# Patient Record
Sex: Male | Born: 1995 | Race: White | Hispanic: No | Marital: Single | Smoking: Never smoker
Health system: Southern US, Community
[De-identification: ages and names within clinical notes are randomized; demographics above are authoritative.]

---
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
## 2015-01-04 ENCOUNTER — Ambulatory Visit: Payer: Self-pay | Admitting: Family Medicine

## 2015-01-12 ENCOUNTER — Ambulatory Visit
Admit: 2015-01-12 | Disposition: A | Discharge status: Home / Self Care | Payer: Self-pay | Attending: Family Medicine | Admitting: Family Medicine

## 2015-07-11 DIAGNOSIS — J45901 Unspecified asthma with (acute) exacerbation: Secondary | ICD-10-CM | POA: Diagnosis not present

## 2015-07-11 DIAGNOSIS — J069 Acute upper respiratory infection, unspecified: Secondary | ICD-10-CM | POA: Diagnosis not present

## 2016-06-12 ENCOUNTER — Ambulatory Visit (INDEPENDENT_AMBULATORY_CARE_PROVIDER_SITE_OTHER): Payer: 59 | Admitting: Family Medicine

## 2016-06-12 ENCOUNTER — Encounter: Payer: Self-pay | Admitting: Family Medicine

## 2016-06-12 DIAGNOSIS — Z87312 Personal history of (healed) stress fracture: Secondary | ICD-10-CM
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
## 2016-06-13 LAB — CBC WITH DIFFERENTIAL/PLATELET
BASOS: 0 %
Basophils Absolute: 0 10*3/uL (ref 0.0–0.2)
EOS (ABSOLUTE): 0.1 10*3/uL (ref 0.0–0.4)
EOS: 1 %
HEMATOCRIT: 45.9 % (ref 37.5–51.0)
HEMOGLOBIN: 15.1 g/dL (ref 12.6–17.7)
IMMATURE GRANULOCYTES: 0 %
Immature Grans (Abs): 0 10*3/uL (ref 0.0–0.1)
Lymphocytes Absolute: 2.3 10*3/uL (ref 0.7–3.1)
Lymphs: 25 %
MCH: 31.5 pg (ref 26.6–33.0)
MCHC: 32.9 g/dL (ref 31.5–35.7)
MCV: 96 fL (ref 79–97)
MONOCYTES: 5 %
MONOS ABS: 0.5 10*3/uL (ref 0.1–0.9)
NEUTROS PCT: 69 %
Neutrophils Absolute: 6.3 10*3/uL (ref 1.4–7.0)
Platelets: 225 10*3/uL (ref 150–379)
RBC: 4.8 x10E6/uL (ref 4.14–5.80)
RDW: 13.5 % (ref 12.3–15.4)
WBC: 9.2 10*3/uL (ref 3.4–10.8)

## 2016-06-13 LAB — VITAMIN D 25 HYDROXY (VIT D DEFICIENCY, FRACTURES): Vit D, 25-Hydroxy: 66.2 ng/mL (ref 30.0–100.0)

## 2016-06-13 LAB — FERRITIN: Ferritin: 147 ng/mL (ref 30–400)

## 2016-09-03 ENCOUNTER — Ambulatory Visit (INDEPENDENT_AMBULATORY_CARE_PROVIDER_SITE_OTHER): Payer: 59 | Admitting: Family Medicine

## 2016-09-03 ENCOUNTER — Encounter: Payer: Self-pay | Admitting: Family Medicine

## 2016-09-03 VITALS — BP 105/66 | HR 58 | Temp 97.9°F | Resp 14

## 2016-09-03 DIAGNOSIS — S060X0A Concussion without loss of consciousness, initial encounter: Secondary | ICD-10-CM
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
## 2016-11-04 IMAGING — CR DG TIBIA/FIBULA 2V*L*
1 series · 4 of 4 positions shown · non-contrast
Comparison: No priors.

CLINICAL DATA: 19-year-old male with pain and tenderness for the
past 2 weeks in the left lower leg. Avid runner.

EXAM:
LEFT TIBIA AND FIBULA - 2 VIEW

[Series 1: kdxr tibia and fibula lt(low leg · 0.14mm/px · 4 of 4 slices shown]
[im 1/4]
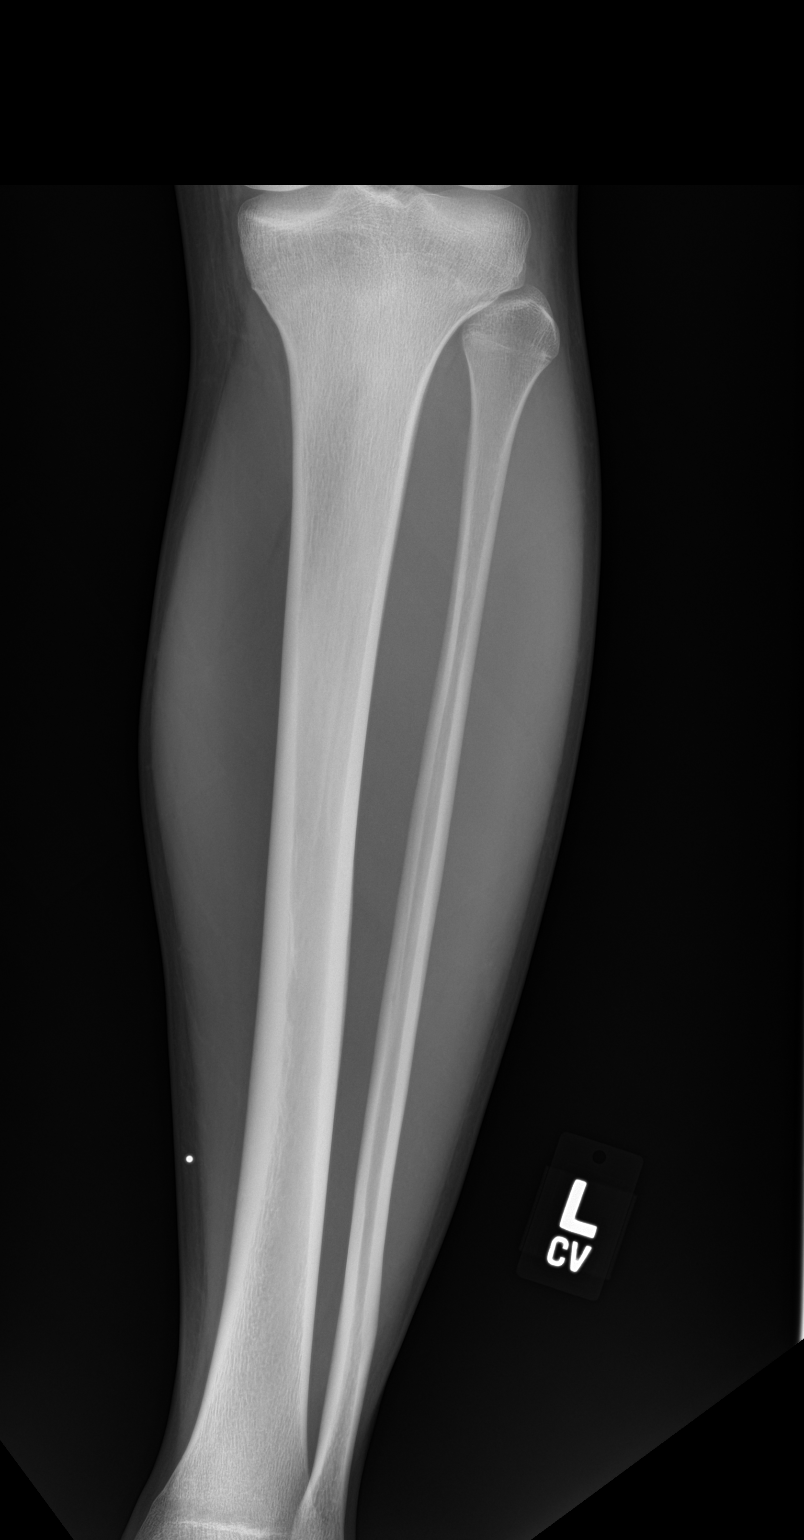
[im 2/4]
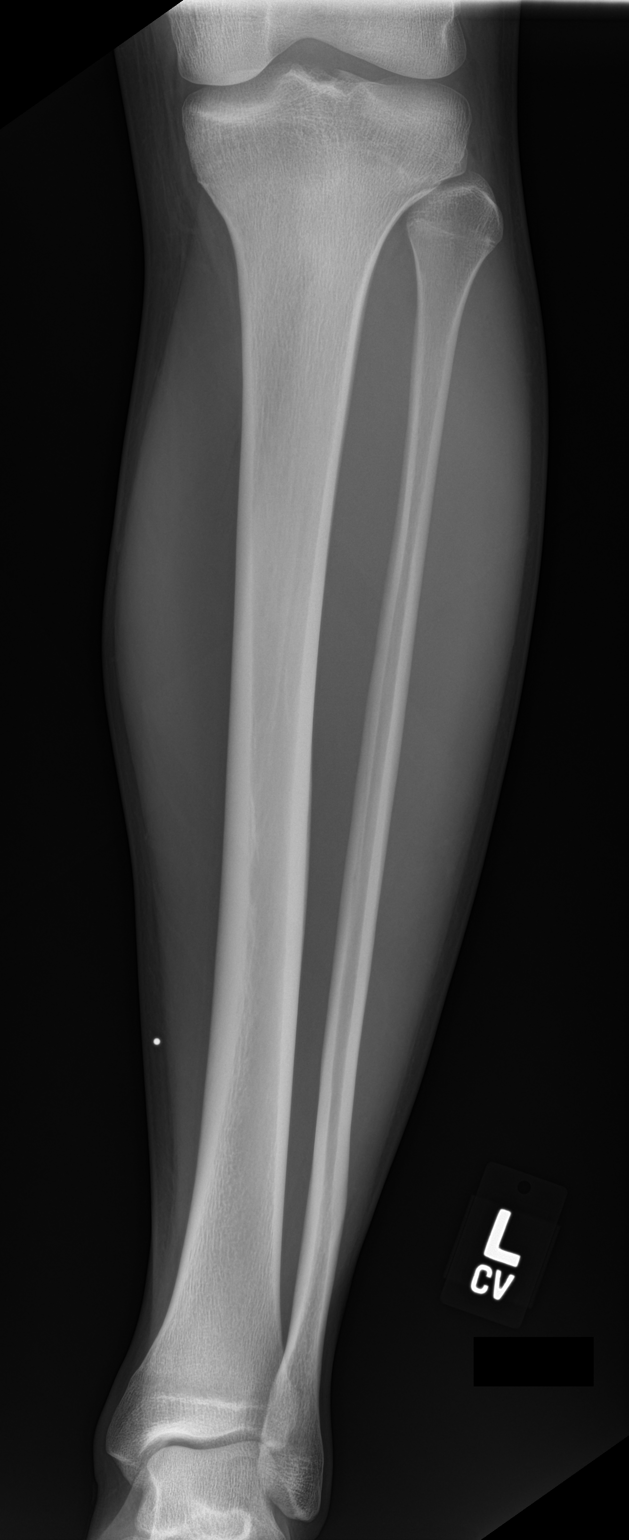
[im 3/4]
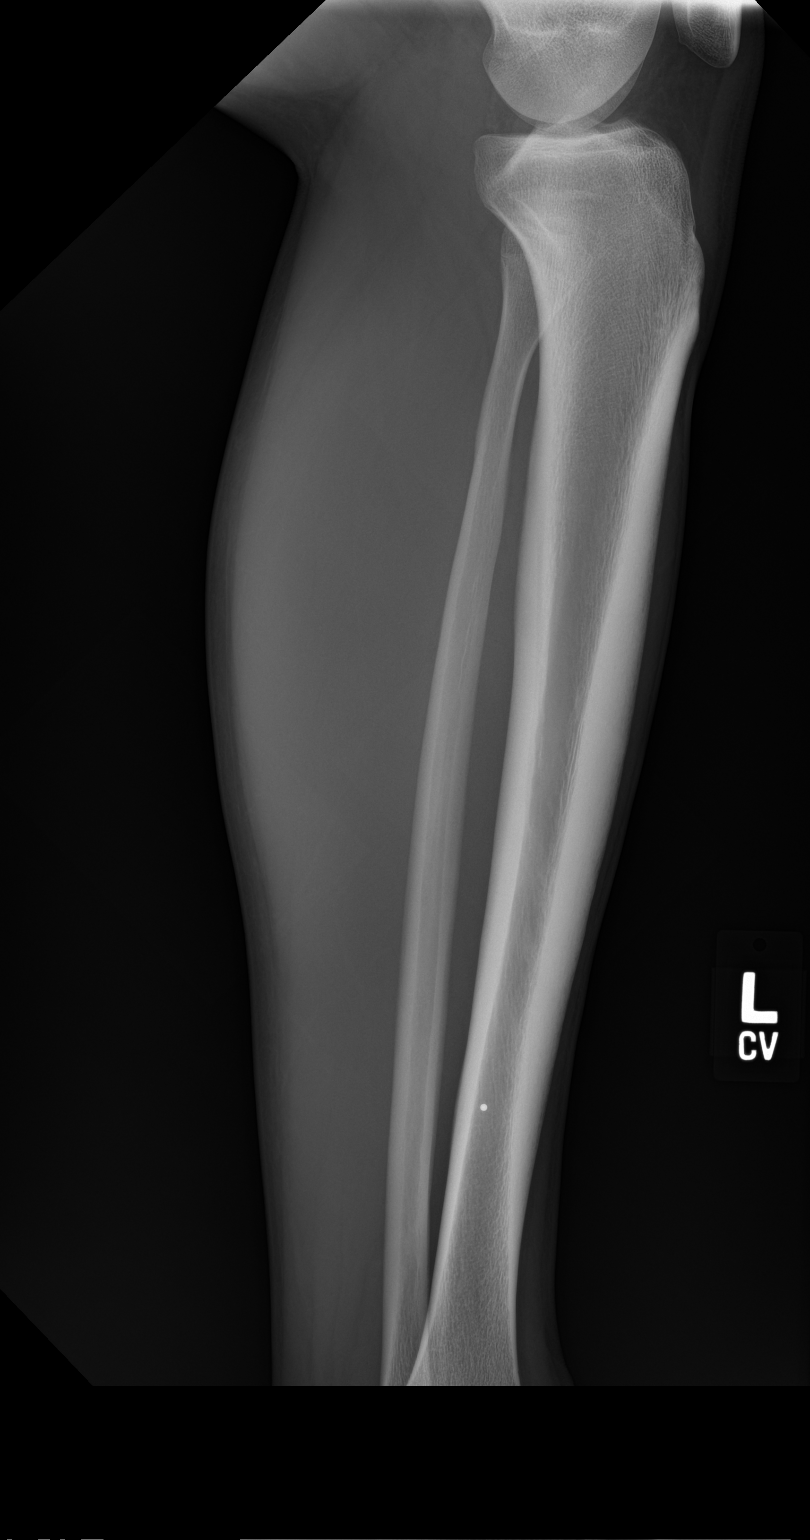
[im 4/4]
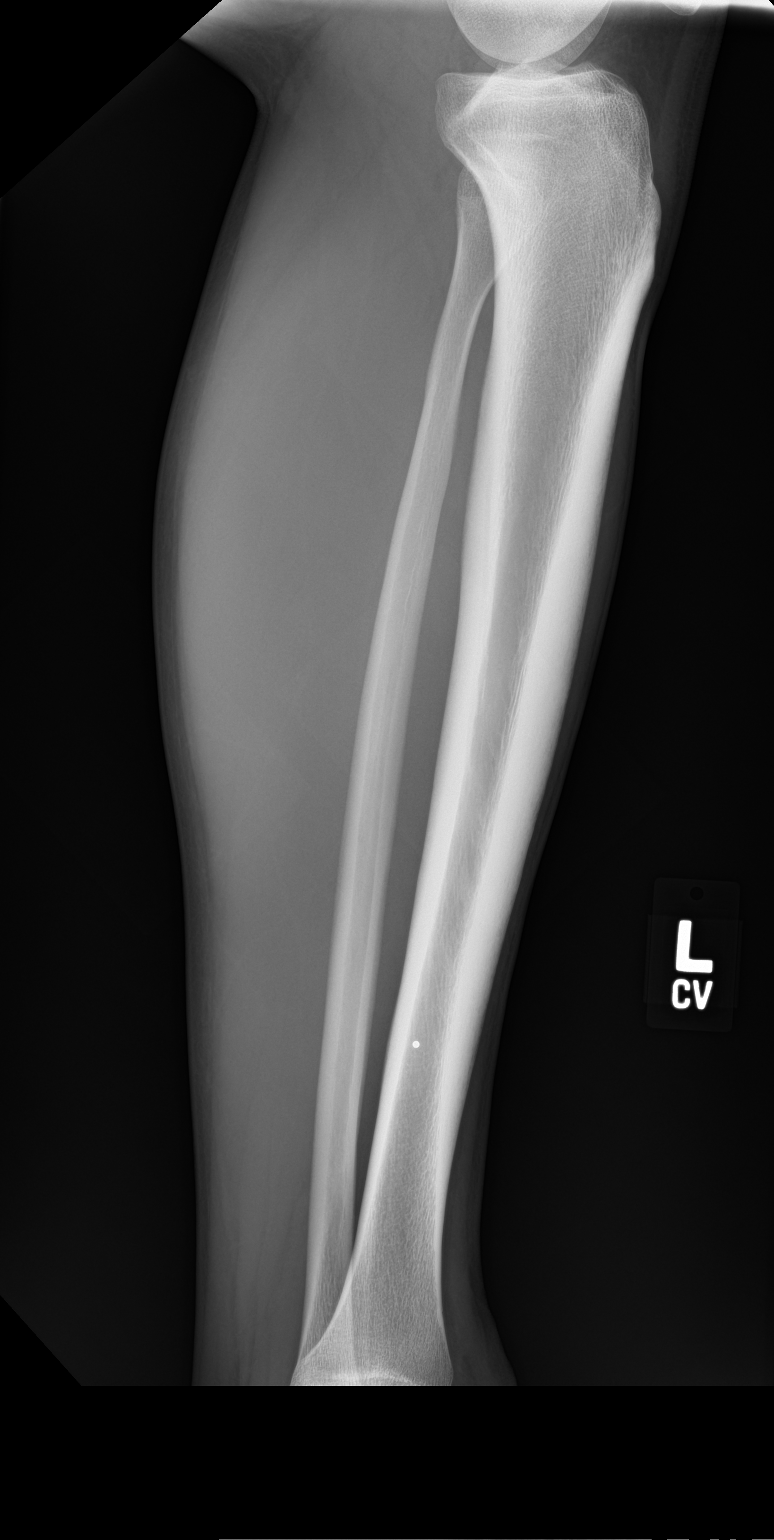

[4 of 4 positions shown; findings below may reference images not displayed]

FINDINGS: Two views of the left tibia and fibula demonstrate no acute
displaced fracture. No nondisplaced stress fracture identified. Soft
tissues are unremarkable in appearance.
IMPRESSION: 1. No acute bony abnormality of the left tibia or fibula.

## 2017-06-11 ENCOUNTER — Ambulatory Visit (INDEPENDENT_AMBULATORY_CARE_PROVIDER_SITE_OTHER): Payer: Self-pay | Admitting: Family Medicine

## 2017-06-11 VITALS — BP 104/70 | HR 71

## 2017-06-11 DIAGNOSIS — Z87312 Personal history of (healed) stress fracture: Secondary | ICD-10-CM
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
## 2017-06-12 LAB — SPECIMEN STATUS

## 2017-06-13 LAB — VITAMIN D 25 HYDROXY (VIT D DEFICIENCY, FRACTURES): Vit D, 25-Hydroxy: 46.4 ng/mL (ref 30.0–100.0)

## 2017-06-13 LAB — FERRITIN: FERRITIN: 110 ng/mL (ref 30–400)
